# Patient Record
Sex: Female | Born: 1990
Health system: Southern US, Community
[De-identification: ages and names within clinical notes are randomized; demographics above are authoritative.]

## PROBLEM LIST (undated history)

## (undated) DIAGNOSIS — F988 Other specified behavioral and emotional disorders with onset usually occurring in childhood and adolescence: Secondary | ICD-10-CM

## (undated) DIAGNOSIS — Q52129 Other and unspecified longitudinal vaginal septum: Principal | ICD-10-CM

## (undated) DIAGNOSIS — G47419 Narcolepsy without cataplexy: Secondary | ICD-10-CM

## (undated) HISTORY — DX: Other and unspecified longitudinal vaginal septum: Q52.129

## (undated) HISTORY — PX: ANKLE SURGERY: SHX546

## (undated) HISTORY — PX: FINGER SURGERY: SHX640

---
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
## 2014-01-03 ENCOUNTER — Encounter (HOSPITAL_COMMUNITY): Payer: Self-pay | Admitting: Emergency Medicine

## 2014-01-03 ENCOUNTER — Emergency Department (HOSPITAL_COMMUNITY): Payer: BC Managed Care – PPO

## 2014-01-03 ENCOUNTER — Emergency Department (HOSPITAL_COMMUNITY)
Admission: EM | Admit: 2014-01-03 | Discharge: 2014-01-03 | Disposition: A | Discharge status: Home / Self Care | Payer: BC Managed Care – PPO | Attending: Emergency Medicine | Admitting: Emergency Medicine

## 2014-01-03 DIAGNOSIS — Y9389 Activity, other specified: Secondary | ICD-10-CM | POA: Insufficient documentation

## 2014-01-03 DIAGNOSIS — IMO0002 Reserved for concepts with insufficient information to code with codable children: Secondary | ICD-10-CM | POA: Insufficient documentation

## 2014-01-03 DIAGNOSIS — Y92410 Unspecified street and highway as the place of occurrence of the external cause: Secondary | ICD-10-CM | POA: Insufficient documentation

## 2014-01-03 DIAGNOSIS — S139XXA Sprain of joints and ligaments of unspecified parts of neck, initial encounter: Secondary | ICD-10-CM | POA: Insufficient documentation

## 2014-01-03 DIAGNOSIS — Z79899 Other long term (current) drug therapy: Secondary | ICD-10-CM | POA: Diagnosis not present

## 2014-01-03 DIAGNOSIS — S161XXA Strain of muscle, fascia and tendon at neck level, initial encounter: Secondary | ICD-10-CM

## 2014-01-03 DIAGNOSIS — S43402A Unspecified sprain of left shoulder joint, initial encounter: Secondary | ICD-10-CM

## 2014-01-03 DIAGNOSIS — S0993XA Unspecified injury of face, initial encounter: Secondary | ICD-10-CM | POA: Diagnosis present

## 2014-01-03 MED ORDER — CYCLOBENZAPRINE HCL 10 MG PO TABS: 10.0000 mg | ORAL_TABLET | Freq: Two times a day (BID) | ORAL | Status: DC | PRN

## 2014-01-03 MED ORDER — KETOROLAC TROMETHAMINE 30 MG/ML IJ SOLN
30.0000 mg | Freq: Once | INTRAMUSCULAR | Status: AC
  Administered 2014-01-03: 30 mg via INTRAMUSCULAR
  Filled 2014-01-03: qty 1

## 2014-01-03 MED ORDER — HYDROCODONE-ACETAMINOPHEN 5-325 MG PO TABS
1.0000 | ORAL_TABLET | Freq: Once | ORAL | Status: AC
Start: 2014-01-03 — End: 2014-01-03
  Administered 2014-01-03: 1 via ORAL
  Filled 2014-01-03: qty 1

## 2014-01-03 MED ORDER — OXYCODONE-ACETAMINOPHEN 5-325 MG PO TABS: ORAL_TABLET | ORAL | Status: DC

## 2014-01-03 NOTE — Discharge Instructions (Signed)
For pain control please take Ibuprofen (also known as Motrin or Advil) 400mg  (this is normally 2 over the counter pills) every 6 hours. Take with food to minimize stomach irritation  Take vicodin or Flexeril for breakthrough pain, do not drink alcohol, drive, care for children or do other critical tasks while taking vicodin or Flexeril.    Cervical Sprain A cervical sprain is when the tissues (ligaments) that hold the neck bones in place stretch or tear. HOME CARE   Put ice on the injured area.  Put ice in a plastic bag.  Place a towel between your skin and the bag.  Leave the ice on for 15-20 minutes, 3-4 times a day.  You may have been given a collar to wear. This collar keeps your neck from moving while you heal.  Do not take the collar off unless told by your doctor.  If you have long hair, keep it outside of the collar.  Ask your doctor before changing the position of your collar. You may need to change its position over time to make it more comfortable.  If you are allowed to take off the collar for cleaning or bathing, follow your doctor's instructions on how to do it safely.  Keep your collar clean by wiping it with mild soap and water. Dry it completely. If the collar has removable pads, remove them every 1-2 days to hand wash them with soap and water. Allow them to air dry. They should be dry before you wear them in the collar.  Do not drive while wearing the collar.  Only take medicine as told by your doctor.  Keep all doctor visits as told.  Keep all physical therapy visits as told.  Adjust your work station so that you have good posture while you work.  Avoid positions and activities that make your problems worse.  Warm up and stretch before being active. GET HELP IF:  Your pain is not controlled with medicine.  You cannot take less pain medicine over time as planned.  Your activity level does not improve as expected. GET HELP RIGHT AWAY IF:   You are  bleeding.  Your stomach is upset.  You have an allergic reaction to your medicine.  You develop new problems that you cannot explain.  You lose feeling (become numb) or you cannot move any part of your body (paralysis).  You have tingling or weakness in any part of your body.  Your symptoms get worse. Symptoms include:  Pain, soreness, stiffness, puffiness (swelling), or a burning feeling in your neck.  Pain when your neck is touched.  Shoulder or upper back pain.  Limited ability to move your neck.  Headache.  Dizziness.  Your hands or arms feel week, lose feeling, or tingle.  Muscle spasms.  Difficulty swallowing or chewing. MAKE SURE YOU:   Understand these instructions.  Will watch your condition.  Will get help right away if you are not doing well or get worse. Document Released: 12/13/2007 Document Revised: 02/26/2013 Document Reviewed: 01/01/2013 [facility] Patient Information 2015 [employer] [place]. This information is not intended to replace advice given to you by your health care provider. Make sure you discuss any questions you have with your health care provider.
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
## 2014-01-03 NOTE — ED Provider Notes (Signed)
Medical screening examination/treatment/procedure(s) were performed by non-physician practitioner and as supervising physician I was immediately available for consultation/collaboration.    Elizer Bostic, MD 01/03/14 1617 
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
## 2018-02-06 DIAGNOSIS — T7500XA Unspecified effects of lightning, initial encounter: Secondary | ICD-10-CM | POA: Insufficient documentation

## 2018-02-28 ENCOUNTER — Other Ambulatory Visit: Payer: Self-pay | Admitting: Obstetrics and Gynecology

## 2018-02-28 ENCOUNTER — Encounter: Payer: Self-pay | Admitting: Obstetrics and Gynecology

## 2018-02-28 DIAGNOSIS — Q52129 Other and unspecified longitudinal vaginal septum: Secondary | ICD-10-CM | POA: Insufficient documentation

## 2018-02-28 HISTORY — DX: Other and unspecified longitudinal vaginal septum: Q52.129

## 2018-03-15 ENCOUNTER — Ambulatory Visit (HOSPITAL_COMMUNITY): Admission: RE | Admit: 2018-03-15 | Payer: BLUE CROSS/BLUE SHIELD | Source: Ambulatory Visit

## 2018-10-08 ENCOUNTER — Emergency Department (HOSPITAL_COMMUNITY)
Admission: EM | Admit: 2018-10-08 | Discharge: 2018-10-08 | Disposition: A | Discharge status: Home / Self Care | Payer: BLUE CROSS/BLUE SHIELD | Attending: Emergency Medicine | Admitting: Emergency Medicine

## 2018-10-08 ENCOUNTER — Other Ambulatory Visit: Payer: Self-pay

## 2018-10-08 ENCOUNTER — Encounter (HOSPITAL_COMMUNITY): Payer: Self-pay | Admitting: Emergency Medicine

## 2018-10-08 DIAGNOSIS — K2900 Acute gastritis without bleeding: Secondary | ICD-10-CM | POA: Diagnosis not present

## 2018-10-08 DIAGNOSIS — F17200 Nicotine dependence, unspecified, uncomplicated: Secondary | ICD-10-CM | POA: Diagnosis not present

## 2018-10-08 DIAGNOSIS — R1084 Generalized abdominal pain: Secondary | ICD-10-CM

## 2018-10-08 DIAGNOSIS — Z79899 Other long term (current) drug therapy: Secondary | ICD-10-CM | POA: Insufficient documentation

## 2018-10-08 DIAGNOSIS — R1033 Periumbilical pain: Secondary | ICD-10-CM | POA: Diagnosis present

## 2018-10-08 HISTORY — DX: Other specified behavioral and emotional disorders with onset usually occurring in childhood and adolescence: F98.8

## 2018-10-08 HISTORY — DX: Narcolepsy without cataplexy: G47.419

## 2018-10-08 LAB — URINALYSIS, ROUTINE W REFLEX MICROSCOPIC
Bacteria, UA: NONE SEEN
Bilirubin Urine: NEGATIVE
Glucose, UA: NEGATIVE mg/dL
Hgb urine dipstick: NEGATIVE
Ketones, ur: 80 mg/dL — AB
Nitrite: NEGATIVE
Protein, ur: NEGATIVE mg/dL
Specific Gravity, Urine: 1.021 (ref 1.005–1.030)
pH: 5 (ref 5.0–8.0)

## 2018-10-08 LAB — CBC WITH DIFFERENTIAL/PLATELET
Abs Immature Granulocytes: 0.03 10*3/uL (ref 0.00–0.07)
Basophils Absolute: 0.1 10*3/uL (ref 0.0–0.1)
Basophils Relative: 1 %
Eosinophils Absolute: 0 10*3/uL (ref 0.0–0.5)
Eosinophils Relative: 0 %
HCT: 44.6 % (ref 36.0–46.0)
Hemoglobin: 13.8 g/dL (ref 12.0–15.0)
Immature Granulocytes: 0 %
Lymphocytes Relative: 2 %
Lymphs Abs: 0.2 10*3/uL — ABNORMAL LOW (ref 0.7–4.0)
MCH: 31.7 pg (ref 26.0–34.0)
MCHC: 30.9 g/dL (ref 30.0–36.0)
MCV: 102.3 fL — ABNORMAL HIGH (ref 80.0–100.0)
Monocytes Absolute: 0.5 10*3/uL (ref 0.1–1.0)
Monocytes Relative: 5 %
Neutro Abs: 10.3 10*3/uL — ABNORMAL HIGH (ref 1.7–7.7)
Neutrophils Relative %: 92 %
Platelets: 166 10*3/uL (ref 150–400)
RBC: 4.36 MIL/uL (ref 3.87–5.11)
RDW: 12.6 % (ref 11.5–15.5)
WBC: 11.1 10*3/uL — ABNORMAL HIGH (ref 4.0–10.5)
nRBC: 0 % (ref 0.0–0.2)

## 2018-10-08 LAB — COMPREHENSIVE METABOLIC PANEL
ALT: 76 U/L — ABNORMAL HIGH (ref 0–44)
AST: 72 U/L — ABNORMAL HIGH (ref 15–41)
Albumin: 4.4 g/dL (ref 3.5–5.0)
Alkaline Phosphatase: 80 U/L (ref 38–126)
Anion gap: 12 (ref 5–15)
BUN: 12 mg/dL (ref 6–20)
CO2: 21 mmol/L — ABNORMAL LOW (ref 22–32)
Calcium: 8.8 mg/dL — ABNORMAL LOW (ref 8.9–10.3)
Chloride: 107 mmol/L (ref 98–111)
Creatinine, Ser: 0.54 mg/dL (ref 0.44–1.00)
GFR calc Af Amer: >60 mL/min (ref >60)
GFR calc non Af Amer: >60 mL/min (ref >60)
Glucose, Bld: 80 mg/dL (ref 70–99)
Potassium: 3.9 mmol/L (ref 3.5–5.1)
Sodium: 140 mmol/L (ref 135–145)
Total Bilirubin: 1.5 mg/dL — ABNORMAL HIGH (ref 0.3–1.2)
Total Protein: 7.3 g/dL (ref 6.5–8.1)

## 2018-10-08 LAB — I-STAT BETA HCG BLOOD, ED (MC, WL, AP ONLY): I-stat hCG, quantitative: <5 m[IU]/mL

## 2018-10-08 LAB — ETHANOL: Alcohol, Ethyl (B): <10 mg/dL

## 2018-10-08 LAB — LIPASE, BLOOD: Lipase: 19 U/L (ref 11–51)

## 2018-10-08 MED ORDER — KETOROLAC TROMETHAMINE 15 MG/ML IJ SOLN
15.0000 mg | Freq: Once | INTRAMUSCULAR | Status: AC
  Administered 2018-10-08: 15 mg via INTRAVENOUS
  Filled 2018-10-08: qty 1

## 2018-10-08 MED ORDER — ONDANSETRON HCL 4 MG PO TABS: 4.0000 mg | ORAL_TABLET | Freq: Four times a day (QID) | ORAL | 0 refills | Status: DC

## 2018-10-08 MED ORDER — ONDANSETRON HCL 4 MG/2ML IJ SOLN
4.0000 mg | Freq: Once | INTRAMUSCULAR | Status: AC
  Administered 2018-10-08: 4 mg via INTRAVENOUS
  Filled 2018-10-08: qty 2

## 2018-10-08 MED ORDER — PANTOPRAZOLE SODIUM 20 MG PO TBEC: 20.0000 mg | DELAYED_RELEASE_TABLET | Freq: Every day | ORAL | 0 refills | Status: DC

## 2018-10-08 MED ORDER — SODIUM CHLORIDE 0.9 % IV BOLUS (SEPSIS)
1000.0000 mL | Freq: Once | INTRAVENOUS | Status: AC
  Administered 2018-10-08: 1000 mL via INTRAVENOUS

## 2018-10-08 NOTE — Discharge Instructions (Addendum)
Please avoid any alcohol use. Please have your liver tests rechecked by your primary care provider. Thank you for allowing me to care for you today. Please return to the emergency department if you have new or worsening symptoms. Take your medications as instructed.

## 2018-10-08 NOTE — ED Provider Notes (Signed)
[facility]-EMERGENCY DEPT Provider Note   CSN: [id number] Arrival date & time: 10/08/18  1357    History   Chief Complaint Chief Complaint  Patient presents with   Abdominal Pain    nausea, vomiting, diarrhea    HPI Tina Harrington is a 28 y.o. female.     Patient is a 28 year old female with no significant past medical history presents emergency department for abdominal pain.  She reports that the abdominal pain started this morning around 830.  Reports that it is a sharp and stabbing pain that is mostly constant around the area of her bellybutton.  It is associated with several episodes of diarrhea and vomiting.  Denies any sick contacts, fever, chills, vaginal discharge, dysuria.  She has not tried anything for relief.  There are no exacerbating or relieving factors.     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 No pertinent surgical history.   OB History   No obstetric history on file.      Home Medications    Prior to Admission medications   Medication Sig Start Date End Date Taking? Authorizing Provider  BIOTIN PO Take 1 tablet by mouth daily.   Yes [provider]  ibuprofen (ADVIL,MOTRIN) 200 MG tablet Take 200 mg by mouth daily as needed for headache or moderate pain.   Yes [provider]  lisdexamfetamine (VYVANSE) 60 MG capsule Take 60 mg by mouth every morning.   Yes [provider]  Melatonin 3 MG TABS Take 3 mg by mouth at bedtime.   Yes [provider]  cyclobenzaprine (FLEXERIL) 10 MG tablet Take 1 tablet (10 mg total) by mouth 2 (two) times daily as needed for muscle spasms. Patient not taking: Reported on 10/08/2018 01/03/14   Pisciotta, Joni Reining, PA-C  ondansetron (ZOFRAN) 4 MG tablet  Take 1 tablet (4 mg total) by mouth every 6 (six) hours. 10/08/18   Arlyn Dunning, PA-C  oxyCODONE-acetaminophen (PERCOCET/ROXICET) 5-325 MG per tablet 1 to 2 tabs PO q6hrs  PRN for pain Patient not taking: Reported on 10/08/2018 01/03/14   Pisciotta, Joni Reining, PA-C  pantoprazole (PROTONIX) 20 MG tablet Take 1 tablet (20 mg total) by mouth daily for 30 days. 10/08/18 11/07/18  Arlyn Dunning, PA-C    Family History History reviewed. No pertinent family history.  Social History Social History   Tobacco Use   Smoking status: Current Some Day Smoker   Smokeless tobacco: Never Used  Substance Use Topics   Alcohol use: Yes    Comment: drinks per week : "a lot"   Drug use: Yes    Types: Cocaine     Allergies   Ciprofloxacin and Azithromycin   Review of Systems Review of Systems  Constitutional: Negative for chills and fever.  HENT: Negative for ear pain and sore throat.   Eyes: Negative for pain and visual disturbance.  Respiratory: Negative for cough and shortness of breath.   Cardiovascular: Negative for chest pain and palpitations.  Gastrointestinal: Positive for abdominal pain, diarrhea, nausea and vomiting. Negative for abdominal distention and constipation.  Genitourinary: Negative for dysuria and hematuria.  Musculoskeletal: Negative for arthralgias and back pain.  Skin: Negative for color change and rash.  Neurological: Negative for seizures and syncope.  All other systems reviewed  and are negative.    Physical Exam Updated Vital Signs BP (!) 133/92 (BP Location: Left Arm)    Pulse 94    Temp 98 F (36.7 C) (Oral)    Resp 18    Ht 5\' 10"  (1.778 m)    Wt 77.1 kg    LMP 09/22/2018 (Exact Date)    SpO2 99%    BMI 24.39 kg/m   Physical Exam Vitals signs and nursing note reviewed.  Constitutional:      Appearance: Normal appearance.  HENT:     Head: Normocephalic.  Eyes:     Conjunctiva/sclera: Conjunctivae normal.  Cardiovascular:     Rate and Rhythm: Normal  rate and regular rhythm.  Pulmonary:     Effort: Pulmonary effort is normal.  Abdominal:     Tenderness: There is abdominal tenderness in the epigastric area. There is no right CVA tenderness, left CVA tenderness, guarding or rebound. Negative signs include Murphy's sign, Rovsing's sign, McBurney's sign, psoas sign and obturator sign.  Skin:    General: Skin is warm and dry.  Neurological:     Mental Status: She is alert.  Psychiatric:        Mood and Affect: Mood normal.      ED Treatments / Results  Labs (all labs ordered are listed, but only abnormal results are displayed) Labs Reviewed  CBC WITH DIFFERENTIAL/PLATELET - Abnormal; Notable for the following components:      Result Value   WBC 11.1 (*)    MCV 102.3 (*)    Neutro Abs 10.3 (*)    Lymphs Abs 0.2 (*)    All other components within normal limits  COMPREHENSIVE METABOLIC PANEL - Abnormal; Notable for the following components:   CO2 21 (*)    Calcium 8.8 (*)    AST 72 (*)    ALT 76 (*)    Total Bilirubin 1.5 (*)    All other components within normal limits  URINALYSIS, ROUTINE W REFLEX MICROSCOPIC - Abnormal; Notable for the following components:   Ketones, ur 80 (*)    Leukocytes,Ua TRACE (*)    All other components within normal limits  LIPASE, BLOOD  ETHANOL  I-STAT BETA HCG BLOOD, ED ([facility], [facility], [facility] ONLY)    EKG None  Radiology No results found.  Procedures Procedures (including critical care time)  Medications Ordered in ED Medications  sodium chloride 0.9 % bolus 1,000 mL (1,000 mLs Intravenous New Bag/Given 10/08/18 1503)  ondansetron (ZOFRAN) injection 4 mg (4 mg Intravenous Given 10/08/18 1503)  ketorolac (TORADOL) 15 MG/ML injection 15 mg (15 mg Intravenous Given 10/08/18 1503)     Initial Impression / Assessment and Plan / ED Course  I have reviewed the triage vital signs and the nursing notes.  Pertinent labs & imaging results that were available during my care of the patient were reviewed  by me and considered in my medical decision making (see chart for details).  Clinical Course as of Oct 08 1611  Tue Oct 08, 2018  1456 Patient is improved with Toradol and fluids.  Likely viral etiology or could be related to excessive alcohol intake.  Patient drinks alcohol daily.  Noted her AST and ALT were mildly elevated.  I counseled patient on the drinking well.  I start patient on Protonix and Zofran.  She was advised to return to the emergency department if symptoms worsen or follow-up with her primary care doctor for repeat LFTs if she remains improved.   [KM]  Clinical Course User Index [KM] Arlyn Dunning, PA-C      Based on review of vitals, medical screening exam, lab work and/or imaging, there does not appear to be an acute, emergent etiology for the patient's symptoms. Counseled pt on good return precautions and encouraged both PCP and ED follow-up as needed.  Prior to discharge, I also discussed incidental imaging findings with patient in detail and advised appropriate, recommended follow-up in detail.  Clinical Impression: 1. Acute gastritis without hemorrhage, unspecified gastritis type   2. Generalized abdominal pain     Disposition: Discharge    This note was prepared with assistance of Dragon voice recognition software. Occasional wrong-word or sound-a-like substitutions may have occurred due to the inherent limitations of voice recognition software.  Final Clinical Impressions(s) / ED Diagnoses   Final diagnoses:  Acute gastritis without hemorrhage, unspecified gastritis type  Generalized abdominal pain    ED Discharge Orders         Ordered    pantoprazole (PROTONIX) 20 MG tablet  Daily     10/08/18 1542    ondansetron (ZOFRAN) 4 MG tablet  Every 6 hours     10/08/18 1542           Jeral Pinch 10/08/18 1613    Linwood Dibbles, MD 10/09/18 [phone]

## 2018-10-08 NOTE — ED Notes (Signed)
Bed: QQ76 Expected date:  Expected time:  Means of arrival:  Comments: EMS-abdominal pain 28 y/o

## 2018-10-08 NOTE — ED Notes (Signed)
Pt reports traveling to [place] and [place] this month. Denies any URI symptoms.

## 2018-10-08 NOTE — ED Triage Notes (Signed)
Patient BIB [facility] from [facility] c/o n/v/d since 0830 this am. Pt reports etoh and cocaine use last night. Pt reports sharp pain across abdomen and umbilical region. States its nothing like she ever felt before. [facility] wanted pt to be evaluated and have lab work done. Pt given about ns and 4 mg iv zofran by EMS.

## 2019-10-23 DIAGNOSIS — W5501XA Bitten by cat, initial encounter: Secondary | ICD-10-CM | POA: Insufficient documentation

## 2019-10-23 DIAGNOSIS — S61451A Open bite of right hand, initial encounter: Secondary | ICD-10-CM | POA: Insufficient documentation

## 2020-04-18 ENCOUNTER — Emergency Department (HOSPITAL_COMMUNITY): Payer: BLUE CROSS/BLUE SHIELD

## 2020-04-18 ENCOUNTER — Encounter (HOSPITAL_COMMUNITY): Payer: Self-pay | Admitting: Emergency Medicine

## 2020-04-18 ENCOUNTER — Emergency Department (HOSPITAL_COMMUNITY)
Admission: EM | Admit: 2020-04-18 | Discharge: 2020-04-18 | Disposition: A | Discharge status: Home / Self Care | Payer: BLUE CROSS/BLUE SHIELD | Attending: Emergency Medicine | Admitting: Emergency Medicine

## 2020-04-18 DIAGNOSIS — S99912A Unspecified injury of left ankle, initial encounter: Secondary | ICD-10-CM | POA: Diagnosis present

## 2020-04-18 DIAGNOSIS — S82852A Displaced trimalleolar fracture of left lower leg, initial encounter for closed fracture: Secondary | ICD-10-CM | POA: Diagnosis not present

## 2020-04-18 DIAGNOSIS — Y9289 Other specified places as the place of occurrence of the external cause: Secondary | ICD-10-CM | POA: Insufficient documentation

## 2020-04-18 DIAGNOSIS — F17200 Nicotine dependence, unspecified, uncomplicated: Secondary | ICD-10-CM | POA: Insufficient documentation

## 2020-04-18 DIAGNOSIS — Y9301 Activity, walking, marching and hiking: Secondary | ICD-10-CM | POA: Diagnosis not present

## 2020-04-18 DIAGNOSIS — W108XXA Fall (on) (from) other stairs and steps, initial encounter: Secondary | ICD-10-CM | POA: Diagnosis not present

## 2020-04-18 MED ORDER — PROPOFOL 10 MG/ML IV BOLUS: 0.5000 mg/kg | Freq: Once | INTRAVENOUS | Status: AC

## 2020-04-18 MED ORDER — ONDANSETRON HCL 4 MG/2ML IJ SOLN
4.0000 mg | Freq: Once | INTRAMUSCULAR | Status: AC
  Administered 2020-04-18: 4 mg via INTRAVENOUS
  Filled 2020-04-18: qty 2

## 2020-04-18 MED ORDER — SODIUM CHLORIDE 0.9 % IV BOLUS
500.0000 mL | Freq: Once | INTRAVENOUS | Status: AC
  Administered 2020-04-18: 500 mL via INTRAVENOUS

## 2020-04-18 MED ORDER — HYDROCODONE-ACETAMINOPHEN 5-325 MG PO TABS: 1.0000 | ORAL_TABLET | Freq: Four times a day (QID) | ORAL | 0 refills | Status: DC | PRN

## 2020-04-18 MED ORDER — MORPHINE SULFATE (PF) 4 MG/ML IV SOLN
4.0000 mg | Freq: Once | INTRAVENOUS | Status: AC
  Administered 2020-04-18: 4 mg via INTRAVENOUS
  Filled 2020-04-18: qty 1

## 2020-04-18 MED ORDER — PROPOFOL 10 MG/ML IV BOLUS
INTRAVENOUS | Status: AC
  Administered 2020-04-18: 200 mg via INTRAVENOUS
  Filled 2020-04-18: qty 20

## 2020-04-18 NOTE — Sedation Documentation (Signed)
Ortho tech at bedside placing splint 
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
## 2020-04-18 NOTE — Progress Notes (Signed)
Orthopedic Tech Progress Note Patient Details:  Tina Harrington Oct 10, 1990 [id number]  Ortho Devices Type of Ortho Device: Post (short leg) splint, Stirrup splint Ortho Device/Splint Location: lle. applied post reduction with drs assist. Ortho Device/Splint Interventions: Ordered, Application, Adjustment   Post Interventions Patient Tolerated: Well Instructions Provided: Care of device, Adjustment of device   [name] Post 04/18/2020, 6:08 AM

## 2020-04-18 NOTE — Sedation Documentation (Signed)
Xray at bedside post reduction

## 2020-04-18 NOTE — Sedation Documentation (Signed)
prpocedural sedation beginning at this time. administered at this time by this rn.

## 2020-04-18 NOTE — Sedation Documentation (Signed)
 given at this time by this rn

## 2020-04-18 NOTE — ED Provider Notes (Signed)
[facility] EMERGENCY DEPARTMENT Provider Note   CSN: [id number] Arrival date & time: 04/18/20  0330     History Chief Complaint  Patient presents with  . Ankle Pain    Tina Harrington is a 29 y.o. female.  Patient presents to the emergency department with a chief complaint of left ankle injury.  She states that she had been drinking tonight and rolled her ankle while walking down some stairs.  There is a visible deformity to her left ankle.  She is not able to ambulate on it.  It is worsened with palpation and movement.  She reports some numbness around her great toe.  Denies any treatments prior to arrival.  The history is provided by the patient. No language interpreter was used.       Past Medical History:  Diagnosis Date  . ADD (attention deficit disorder)   . 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 Narcolepsy     Patient Active Problem List   Diagnosis Date Noted  . Longitudinal vaginal septum 02/28/2018    History reviewed. No pertinent surgical history.   OB History   No obstetric history on file.     No family history on file.  Social History   Tobacco Use  . Smoking status: Current Some Day Smoker  . Smokeless tobacco: Never Used  Vaping Use  . Vaping Use: Never used  Substance Use Topics  . Alcohol use: Yes    Comment: drinks per week : "a lot"  . Drug use: Yes    Types: Cocaine    Home Medications Prior to Admission medications   Medication Sig Start Date End Date Taking? Authorizing Provider  BIOTIN PO Take 1 tablet by mouth daily.    [provider]  cyclobenzaprine (FLEXERIL) 10 MG tablet Take 1 tablet (10 mg total) by mouth 2 (two) times daily as needed for muscle spasms. Patient not taking: Reported on 10/08/2018 01/03/14   Pisciotta, Joni Reining, PA-C  ibuprofen (ADVIL,MOTRIN) 200 MG tablet Take 200 mg by mouth daily as needed for headache or moderate pain.    [provider]  lisdexamfetamine (VYVANSE)  60 MG capsule Take 60 mg by mouth every morning.    [provider]  Melatonin 3 MG TABS Take 3 mg by mouth at bedtime.    [provider]  ondansetron (ZOFRAN) 4 MG tablet Take 1 tablet (4 mg total) by mouth every 6 (six) hours. 10/08/18   Arlyn Dunning, PA-C  oxyCODONE-acetaminophen (PERCOCET/ROXICET) 5-325 MG per tablet 1 to 2 tabs PO q6hrs  PRN for pain Patient not taking: Reported on 10/08/2018 01/03/14   Pisciotta, Joni Reining, PA-C  pantoprazole (PROTONIX) 20 MG tablet Take 1 tablet (20 mg total) by mouth daily for 30 days. 10/08/18 11/07/18  Arlyn Dunning, PA-C    Allergies    Ciprofloxacin and Azithromycin  Review of Systems   Review of Systems  All other systems reviewed and are negative.   Physical Exam Updated Vital Signs BP (!) 135/94   Pulse 86   Temp 98.3 F (36.8 C) (Oral)   Resp (!) 23   SpO2 100%   Physical Exam Nursing note and vitals reviewed.  Constitutional: Pt appears well-developed and well-nourished. No distress.  HENT:  Head: Normocephalic and atraumatic.  Eyes: Conjunctivae are normal.  Neck: Normal range of motion.  Cardiovascular: Normal rate, regular rhythm. Intact distal pulses.   Capillary refill < 3 sec.  Pulmonary/Chest: Effort normal and breath sounds normal.  Musculoskeletal:  Left ankle Pt exhibits obvious deformity, suspected dislocation, tenderness to palpation, diffuse swelling and contusion.   ROM: Deferred  Strength: Deferred Neurological: Pt  is alert. Coordination normal.  Sensation: Slightly decreased between first and second toes, otherwise intact Skin: Skin is warm and dry. Pt is not diaphoretic.  No evidence of open wound or skin tenting Psychiatric: Pt has a normal mood and affect.    ED Results / Procedures / Treatments   Labs (all labs ordered are listed, but only abnormal results are displayed) Labs Reviewed - No data to display  EKG None  Radiology DG Ankle Complete Left  Result Date:  04/18/2020 CLINICAL DATA:  Left ankle deformity after fall. EXAM: LEFT ANKLE COMPLETE - 3+ VIEW COMPARISON:  None. FINDINGS: Diffuse soft tissue swelling. Fracture/dislocation of the ankle, with the talus posteriorly and less so medially positioned relative to the distal tibia. Trimalleolar fracture with significant displacement. No talar dome fracture identified. IMPRESSION: Trimalleolar fracture dislocation of the ankle, as detailed above. Consider more complete characterization with CT. Electronically Signed   By: Jeronimo Greaves M.D.   On: 04/18/2020 05:06    Procedures Reduction of dislocation  Date/Time: 04/18/2020 5:50 AM Performed by: Roxy Horseman, PA-C Authorized by: Roxy Horseman, PA-C  Preparation: Patient was prepped and draped in the usual sterile fashion. Local anesthesia used: no  Anesthesia: Local anesthesia used: no  Sedation: Patient sedated: yes Sedation type: (See Dr. Edwin Dada note)  Patient tolerance: patient tolerated the procedure well with no immediate complications Comments: Patient underwent close reduction of left trimalleolar fracture dislocation.  The ankle was reduced using traction, and then placed in a splint.  There was good anatomical alignment post procedure.  Patient had intact distal pulses, good sensation, and was able to wiggle her toes post procedure.     Medications Ordered in ED Medications  propofol (DIPRIVAN) 10 mg/mL bolus/IV push 0.5 mg/kg (200 mg Intravenous Given 04/18/20 0542)  morphine 4 MG/ML injection 4 mg (4 mg Intravenous Given 04/18/20 0543)  ondansetron (ZOFRAN) injection 4 mg (4 mg Intravenous Given 04/18/20 0543)  sodium chloride 0.9 % bolus 500 mL (500 mLs Intravenous New Bag/Given 04/18/20 0542)    ED Course  I have reviewed the triage vital signs and the nursing notes.  Pertinent labs & imaging results that were available during my care of the patient were reviewed by me and considered in my medical decision making  (see chart for details).    MDM Rules/Calculators/A&P                          Patient here with left ankle injury.  She sustained a trimalleolar fracture dislocation of the left ankle after rolling her ankle while walking down the stairs tonight.  She has been drinking.  She denies pain elsewhere.  She appears stable.  The fracture/dislocation was reduced by me, sedation performed by Dr. Jacqulyn Bath.     I discussed the case with Dr. Magnus Ivan, from orthopedics, who advises that the patient call his office to make an appointment for next week.  Patient is to be kept nonweightbearing.   Final Clinical Impression(s) / ED Diagnoses Final diagnoses:  Closed trimalleolar fracture of left ankle, initial encounter    Rx / DC Orders ED Discharge Orders         Ordered    HYDROcodone-acetaminophen (NORCO/VICODIN) 5-325 MG tablet  Every 6 hours PRN        04/18/20 0630  Roxy Horseman, PA-C 04/18/20 [id number]    Maia Plan, MD 04/21/20 [phone]

## 2020-04-18 NOTE — Discharge Instructions (Addendum)
You sustained a fracture/dislocation of your left ankle.  The ankle was returned to anatomic alignment while in the emergency department.  You need to remain nonweightbearing on the left ankle.  He need to contact the orthopedic doctor listed on this document for follow-up.  You may require surgery to fix this permanently.  Take pain medication as prescribed.  Do not drive or operate heavy machinery while taking your pain medication.  It is like driving drunk.  Do not mix alcohol and pain medicine.  Take your medications as prescribed.

## 2020-04-18 NOTE — Sedation Documentation (Signed)
Manipulation beginning at this time by pa browning

## 2020-04-18 NOTE — Sedation Documentation (Signed)
Pt tolerated procedural well, cap refill wnl, nadn, pt back to baseline. vss on ccm.

## 2020-04-18 NOTE — Progress Notes (Signed)
Orthopedic Tech Progress Note Patient Details:  Tina Harrington [birth year] [id number]  Ortho Devices Type of Ortho Device: Crutches Ortho Device/Splint Location: lle. applied post reduction with drs assist. Ortho Device/Splint Interventions: Ordered, Adjustment   Post Interventions Patient Tolerated: Well Instructions Provided: Poper ambulation with device, Care of device, Adjustment of device   Toretto Tingler P Tuwanna Krausz 04/18/2020, 7:34 AM

## 2020-04-19 ENCOUNTER — Telehealth: Payer: Self-pay

## 2020-04-19 ENCOUNTER — Ambulatory Visit: Payer: Self-pay

## 2020-04-19 ENCOUNTER — Encounter: Payer: Self-pay | Admitting: Physician Assistant

## 2020-04-19 ENCOUNTER — Ambulatory Visit: Payer: BLUE CROSS/BLUE SHIELD | Admitting: Physician Assistant

## 2020-04-19 DIAGNOSIS — F900 Attention-deficit hyperactivity disorder, predominantly inattentive type: Secondary | ICD-10-CM | POA: Insufficient documentation

## 2020-04-19 DIAGNOSIS — N926 Irregular menstruation, unspecified: Secondary | ICD-10-CM | POA: Insufficient documentation

## 2020-04-19 DIAGNOSIS — R87619 Unspecified abnormal cytological findings in specimens from cervix uteri: Secondary | ICD-10-CM | POA: Insufficient documentation

## 2020-04-19 DIAGNOSIS — S82852A Displaced trimalleolar fracture of left lower leg, initial encounter for closed fracture: Secondary | ICD-10-CM

## 2020-04-19 DIAGNOSIS — R8290 Unspecified abnormal findings in urine: Secondary | ICD-10-CM | POA: Insufficient documentation

## 2020-04-19 DIAGNOSIS — G43909 Migraine, unspecified, not intractable, without status migrainosus: Secondary | ICD-10-CM | POA: Insufficient documentation

## 2020-04-19 DIAGNOSIS — B9689 Other specified bacterial agents as the cause of diseases classified elsewhere: Secondary | ICD-10-CM | POA: Insufficient documentation

## 2020-04-19 DIAGNOSIS — G47419 Narcolepsy without cataplexy: Secondary | ICD-10-CM | POA: Insufficient documentation

## 2020-04-19 MED ORDER — HYDROCODONE-ACETAMINOPHEN 5-325 MG PO TABS: 1.0000 | ORAL_TABLET | Freq: Four times a day (QID) | ORAL | 0 refills | Status: DC | PRN

## 2020-04-19 NOTE — Telephone Encounter (Signed)
Patient called in wanting refill of prescription of hydrocodone
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
## 2020-04-20 MED ORDER — HYDROCODONE-ACETAMINOPHEN 5-325 MG PO TABS: 1.0000 | ORAL_TABLET | Freq: Four times a day (QID) | ORAL | 0 refills | Status: DC | PRN

## 2020-04-20 NOTE — Telephone Encounter (Signed)
Please advise 

## 2020-04-20 NOTE — Addendum Note (Signed)
Addended by: Richardean Canal on: 04/20/2020 09:03 AM   Modules accepted: Orders

## 2020-04-21 NOTE — ED Provider Notes (Signed)
Medical screening examination/treatment/procedure(s) were conducted as a shared visit with non-physician practitioner(s) and myself.  I personally evaluated the patient during the encounter.  I directly supervised the patient's ankle fracture/dslocation treatment. Sedation note below.   .Sedation  Date/Time: 04/21/2020 7:27 AM Performed by: Maia Plan, MD Authorized by: Maia Plan, MD   Consent:    Consent obtained:  Verbal and written   Consent given by:  Patient   Risks discussed:  Allergic reaction, dysrhythmia, inadequate sedation, nausea, prolonged hypoxia resulting in organ damage, prolonged sedation necessitating reversal, respiratory compromise necessitating ventilatory assistance and intubation and vomiting   Alternatives discussed:  Analgesia without sedation, anxiolysis and regional anesthesia Universal protocol:    Procedure explained and questions answered to patient or proxy's satisfaction: yes     Relevant documents present and verified: yes     Test results available and properly labeled: yes     Imaging studies available: yes     Required blood products, implants, devices, and special equipment available: yes     Site/side marked: yes     Immediately prior to procedure a time out was called: yes     Patient identity confirmation method:  Verbally with patient Indications:    Procedure necessitating sedation performed by:  Physician performing sedation Pre-sedation assessment:    Time since last food or drink:  4 hours   ASA classification: class 1 - normal, healthy patient     Neck mobility: normal     Mouth opening:  3 or more finger widths   Thyromental distance:  4 finger widths   Mallampati score:  I - soft palate, uvula, fauces, pillars visible   Pre-sedation assessments completed and reviewed: airway patency, cardiovascular function, hydration status, mental status, nausea/vomiting, pain level, respiratory function and temperature   Immediate  pre-procedure details:    Reassessment: Patient reassessed immediately prior to procedure     Reviewed: vital signs, relevant labs/tests and NPO status     Verified: bag valve mask available, emergency equipment available, intubation equipment available, IV patency confirmed, oxygen available and suction available   Procedure details (see MAR for exact dosages):    Preoxygenation:  Nasal cannula   Sedation:  Propofol   Intended level of sedation: deep   Intra-procedure monitoring:  Blood pressure monitoring, cardiac monitor, continuous pulse oximetry, frequent LOC assessments, frequent vital sign checks and continuous capnometry   Intra-procedure events: none     Total Provider sedation time (minutes):  30 Post-procedure details:    Attendance: Constant attendance by certified staff until patient recovered     Recovery: Patient returned to pre-procedure baseline     Post-sedation assessments completed and reviewed: airway patency, cardiovascular function, hydration status, mental status, nausea/vomiting, pain level, respiratory function and temperature     Patient is stable for discharge or admission: yes     Patient tolerance:  Tolerated well, no immediate complications     Alona Bene, MD Emergency Medicine    Pansy Ostrovsky, Arlyss Repress, MD 04/21/20 [phone]

## 2020-04-22 ENCOUNTER — Other Ambulatory Visit: Payer: Self-pay | Admitting: Orthopaedic Surgery

## 2020-04-22 DIAGNOSIS — S82842A Displaced bimalleolar fracture of left lower leg, initial encounter for closed fracture: Secondary | ICD-10-CM

## 2020-04-22 MED ORDER — OXYCODONE HCL 5 MG PO TABS: 5.0000 mg | ORAL_TABLET | Freq: Four times a day (QID) | ORAL | 0 refills | Status: DC | PRN

## 2020-04-22 MED ORDER — ONDANSETRON 4 MG PO TBDP: 4.0000 mg | ORAL_TABLET | Freq: Three times a day (TID) | ORAL | 0 refills | Status: DC | PRN

## 2020-04-26 ENCOUNTER — Telehealth: Payer: Self-pay | Admitting: Radiology

## 2020-04-26 NOTE — Telephone Encounter (Signed)
FYI  Patient called triage line stating that she had surgery last week and had fracture blisters that she feels like are busting open and causing pain. She was unsure if she should come in prior to regularly scheduled post op visit.  After speaking with Bronson Curb, I advised patient that the blisters were taken down at the time of surgery and xeroform was placed over where they are. At this time, she does not need an antibiotic and can keep her regularly scheduled appt. Patient advised to call if continued questions/problems.

## 2020-05-06 ENCOUNTER — Ambulatory Visit (INDEPENDENT_AMBULATORY_CARE_PROVIDER_SITE_OTHER): Payer: BC Managed Care – PPO

## 2020-05-06 ENCOUNTER — Inpatient Hospital Stay: Payer: BLUE CROSS/BLUE SHIELD | Admitting: Physician Assistant

## 2020-05-06 ENCOUNTER — Ambulatory Visit (INDEPENDENT_AMBULATORY_CARE_PROVIDER_SITE_OTHER): Payer: BC Managed Care – PPO | Admitting: Orthopaedic Surgery

## 2020-05-06 ENCOUNTER — Encounter: Payer: Self-pay | Admitting: Orthopaedic Surgery

## 2020-05-06 DIAGNOSIS — Z9889 Other specified postprocedural states: Secondary | ICD-10-CM

## 2020-05-06 DIAGNOSIS — Z8781 Personal history of (healed) traumatic fracture: Secondary | ICD-10-CM

## 2020-05-06 DIAGNOSIS — S82852A Displaced trimalleolar fracture of left lower leg, initial encounter for closed fracture: Secondary | ICD-10-CM

## 2020-05-06 MED ORDER — OXYCODONE HCL 5 MG PO TABS: 5.0000 mg | ORAL_TABLET | Freq: Four times a day (QID) | ORAL | 0 refills | Status: DC | PRN

## 2020-05-06 NOTE — Progress Notes (Signed)
The patient is now 2 weeks status post open reduction/internal fixation of a complex left ankle fracture dislocation.  This is a trimalleolar fracture.  We were able to stabilize the ankle.  We stressed the syndesmosis after fixing the lateral medial malleolus syndrome was stable.  The posterior malleolus component was minimal.  She has been in a splint since surgery.  The splint is removed today.  Her foot is well-perfused.  All the fracture blisters that were there before that we unroofed have healed.  The medial lateral incisions look good with intact sutures.  We will remove the sutures today and placed Steri-Strips.  She can work on gentle range of motion of her left ankle.  She will remain nonweightbearing but in a cam walking boot which she can remove for ankle motion and hygiene as well as she does not need to sleep in the boot.  3 views of the left ankle show intact hardware from fixation of this complex left ankle fracture.  I will see her back in 4 weeks for repeat 3 views of the left ankle.  At that point we will likely advance her to full weightbearing.

## 2020-06-07 ENCOUNTER — Ambulatory Visit: Payer: BC Managed Care – PPO | Admitting: Orthopaedic Surgery

## 2020-06-23 ENCOUNTER — Ambulatory Visit (INDEPENDENT_AMBULATORY_CARE_PROVIDER_SITE_OTHER): Payer: Self-pay

## 2020-06-23 ENCOUNTER — Ambulatory Visit (INDEPENDENT_AMBULATORY_CARE_PROVIDER_SITE_OTHER): Payer: Self-pay | Admitting: Orthopaedic Surgery

## 2020-06-23 ENCOUNTER — Encounter: Payer: Self-pay | Admitting: Orthopaedic Surgery

## 2020-06-23 DIAGNOSIS — S82852A Displaced trimalleolar fracture of left lower leg, initial encounter for closed fracture: Secondary | ICD-10-CM

## 2020-06-23 DIAGNOSIS — Z9889 Other specified postprocedural states: Secondary | ICD-10-CM

## 2020-06-23 DIAGNOSIS — Z8781 Personal history of (healed) traumatic fracture: Secondary | ICD-10-CM

## 2020-06-23 NOTE — Progress Notes (Signed)
The patient is about 9 weeks out from surgery on a left closed ankle fracture dislocation.  This was a trimalleolar fracture.  We have now had her in a cam walking boot with weightbearing as tolerated.  She will still having some pain in the ankle but overall is making progress.  Examination ankle does show some stiffness throughout its arc of motion.  It is ligamentously stable and congruently well located.  The incisions of healed nicely.  3 views of the ankle on the left side show a healed trimalleolar fracture status post surgical fixation.  The ankle mortise is congruent.   Transition her to an ASO letter work on stretching exercises for her ankle.  She can weight-bear as tolerated.  She will transition out of the boot to the ASO when she is comfortable which from my standpoint can be as soon as today.  We will leave that up to her though.  We will see her back in 4 weeks but no x-rays are needed.  This is for an exam and to help determine whether or not she would benefit from outpatient physical therapy which I have offered to her.

## 2020-07-21 ENCOUNTER — Other Ambulatory Visit: Payer: Self-pay

## 2020-07-21 ENCOUNTER — Ambulatory Visit (INDEPENDENT_AMBULATORY_CARE_PROVIDER_SITE_OTHER): Payer: Self-pay | Admitting: Orthopaedic Surgery

## 2020-07-21 ENCOUNTER — Encounter: Payer: Self-pay | Admitting: Orthopaedic Surgery

## 2020-07-21 DIAGNOSIS — Z9889 Other specified postprocedural states: Secondary | ICD-10-CM

## 2020-07-21 DIAGNOSIS — S82852A Displaced trimalleolar fracture of left lower leg, initial encounter for closed fracture: Secondary | ICD-10-CM

## 2020-07-21 DIAGNOSIS — Z8781 Personal history of (healed) traumatic fracture: Secondary | ICD-10-CM

## 2020-07-21 NOTE — Progress Notes (Signed)
The patient is right at 3 months status post open reduction/fixation of a complex trimalleolar ankle fracture.  We fixed the medial lateral malleolus of her left ankle.  She is still walking in a cam walking boot but states she is doing better.  She has not been able to wear an ASO because of the swelling and getting into a regular shoe.  Examination of her left ankle does show some limitations with dorsiflexion and plantarflexion.  The ankle feels like this is stable.  The incisions have healed but there is still some residual swelling.  I would like to send her to outpatient physical therapy at this standpoint to work on improving the range of motion of her left ankle as well as strengthening and potentially edema reduction.  I would like to then see her back at about 6 weeks.  At that visit I would like to repeat 3 views of the left ankle.  All questions and concerns were answered and addressed.

## 2020-08-03 ENCOUNTER — Ambulatory Visit (INDEPENDENT_AMBULATORY_CARE_PROVIDER_SITE_OTHER): Payer: Self-pay | Admitting: Physical Therapy

## 2020-08-03 ENCOUNTER — Other Ambulatory Visit: Payer: Self-pay

## 2020-08-03 DIAGNOSIS — M25572 Pain in left ankle and joints of left foot: Secondary | ICD-10-CM

## 2020-08-03 DIAGNOSIS — R262 Difficulty in walking, not elsewhere classified: Secondary | ICD-10-CM

## 2020-08-03 DIAGNOSIS — R600 Localized edema: Secondary | ICD-10-CM

## 2020-08-03 DIAGNOSIS — M6281 Muscle weakness (generalized): Secondary | ICD-10-CM
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
## 2020-08-03 NOTE — Patient Instructions (Signed)
Access Code: [email or web address] URL: [email or web address] Date: 08/03/2020 Prepared by: Narda Amber  Exercises Seated Ankle Alphabet - 2-3 x daily - 7 x weekly - 3 sets - 10 reps Seated Ankle Plantarflexion with Resistance - 2-3 x daily - 7 x weekly - 2 sets - 10 reps Long Sitting Ankle Eversion with Resistance - 2-3 x daily - 7 x weekly - 2 sets - 10 reps Long Sitting Ankle Inversion with Anchored Resistance - 2-3 x daily - 7 x weekly - 2 sets - 10 reps Long Sitting Ankle Dorsiflexion with Anchored Resistance - 2-3 x daily - 7 x weekly - 2 sets - 10 reps Seated Toe Towel Scrunches - 2-3 x daily - 7 x weekly - 3 reps Seated Marble Pick-Up with Toes - 2-3 x daily - 7 x weekly - 10 reps Long Sitting Calf Stretch with Strap - 2-3 x daily - 7 x weekly - 5 reps - 30 seconds hold Soleus Stretch on Wall - 2-3 x daily - 7 x weekly - 3 reps - 30 seconds hold

## 2020-08-12 ENCOUNTER — Telehealth: Payer: Self-pay | Admitting: Rehabilitative and Restorative Service Providers"

## 2020-08-12 ENCOUNTER — Encounter: Payer: Self-pay | Admitting: Rehabilitative and Restorative Service Providers"

## 2020-08-12 NOTE — Telephone Encounter (Signed)
Called to check on because of no show for today's 1:45 appointment.  Left message reminding her of her next appointment Tuesday February 8 at 2:30 PM with [facility].  Left [phone] number to call to cancel or reschedule if unable to attend her next appointment.

## 2020-08-17 ENCOUNTER — Encounter: Payer: Self-pay | Admitting: Physical Therapy

## 2020-08-17 ENCOUNTER — Ambulatory Visit (INDEPENDENT_AMBULATORY_CARE_PROVIDER_SITE_OTHER): Payer: Self-pay | Admitting: Physical Therapy

## 2020-08-17 ENCOUNTER — Other Ambulatory Visit: Payer: Self-pay

## 2020-08-17 DIAGNOSIS — M6281 Muscle weakness (generalized): Secondary | ICD-10-CM

## 2020-08-17 DIAGNOSIS — M25572 Pain in left ankle and joints of left foot: Secondary | ICD-10-CM

## 2020-08-17 DIAGNOSIS — R600 Localized edema: Secondary | ICD-10-CM

## 2020-08-17 DIAGNOSIS — R262 Difficulty in walking, not elsewhere classified: Secondary | ICD-10-CM

## 2020-08-17 NOTE — Patient Instructions (Signed)
Access Code: [email or web address] URL: [email or web address] Date: 08/17/2020 Prepared by: Narda Amber  Exercises Seated Ankle Alphabet - 2-3 x daily - 7 x weekly Seated Ankle Plantarflexion with Resistance - 2-3 x daily - 7 x weekly - 2 sets - 10 reps Long Sitting Ankle Eversion with Resistance - 2-3 x daily - 7 x weekly - 2 sets - 10 reps Long Sitting Ankle Inversion with Anchored Resistance - 2-3 x daily - 7 x weekly - 2 sets - 10 reps Long Sitting Ankle Dorsiflexion with Anchored Resistance - 2-3 x daily - 7 x weekly - 2 sets - 10 reps Seated Toe Towel Scrunches - 2-3 x daily - 7 x weekly - 3 reps Seated Marble Pick-Up with Toes - 2-3 x daily - 7 x weekly - 10 reps Long Sitting Calf Stretch with Strap - 2-3 x daily - 7 x weekly - 5 reps - 30 seconds hold Soleus Stretch on Wall - 2-3 x daily - 7 x weekly - 3 reps - 30 seconds hold Standing Heel Raise - 2-3 x daily - 7 x weekly - 2 sets - 10 reps Standing Gastroc Stretch on Foam 1/2 Roll - 2-3 x daily - 7 x weekly - 3 reps - 30 seconds hold Single Leg Balance with Opposite Leg Star Reach - 2-3 x daily - 7 x weekly - 2 sets - 5 reps
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
## 2020-08-24 ENCOUNTER — Encounter: Payer: Self-pay | Admitting: Physical Therapy

## 2020-08-24 ENCOUNTER — Ambulatory Visit (INDEPENDENT_AMBULATORY_CARE_PROVIDER_SITE_OTHER): Payer: Self-pay | Admitting: Physical Therapy

## 2020-08-24 ENCOUNTER — Other Ambulatory Visit: Payer: Self-pay

## 2020-08-24 DIAGNOSIS — R600 Localized edema: Secondary | ICD-10-CM

## 2020-08-24 DIAGNOSIS — M25572 Pain in left ankle and joints of left foot: Secondary | ICD-10-CM

## 2020-08-24 DIAGNOSIS — M6281 Muscle weakness (generalized): Secondary | ICD-10-CM

## 2020-08-24 DIAGNOSIS — R262 Difficulty in walking, not elsewhere classified: Secondary | ICD-10-CM
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
## 2020-08-31 ENCOUNTER — Ambulatory Visit (INDEPENDENT_AMBULATORY_CARE_PROVIDER_SITE_OTHER): Payer: Self-pay | Admitting: Physical Therapy

## 2020-08-31 ENCOUNTER — Other Ambulatory Visit: Payer: Self-pay

## 2020-08-31 ENCOUNTER — Encounter: Payer: Self-pay | Admitting: Physical Therapy

## 2020-08-31 DIAGNOSIS — R262 Difficulty in walking, not elsewhere classified: Secondary | ICD-10-CM

## 2020-08-31 DIAGNOSIS — R600 Localized edema: Secondary | ICD-10-CM

## 2020-08-31 DIAGNOSIS — M25572 Pain in left ankle and joints of left foot: Secondary | ICD-10-CM

## 2020-08-31 DIAGNOSIS — M6281 Muscle weakness (generalized): Secondary | ICD-10-CM
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
## 2020-09-01 ENCOUNTER — Ambulatory Visit: Payer: Self-pay

## 2020-09-01 ENCOUNTER — Ambulatory Visit (INDEPENDENT_AMBULATORY_CARE_PROVIDER_SITE_OTHER): Payer: Self-pay | Admitting: Orthopaedic Surgery

## 2020-09-01 ENCOUNTER — Encounter: Payer: Self-pay | Admitting: Orthopaedic Surgery

## 2020-09-01 DIAGNOSIS — Z9889 Other specified postprocedural states: Secondary | ICD-10-CM

## 2020-09-01 DIAGNOSIS — Z8781 Personal history of (healed) traumatic fracture: Secondary | ICD-10-CM

## 2020-09-01 NOTE — Progress Notes (Signed)
The patient comes in today about 4 months status post a fracture dislocation of her left ankle that did require open reduction/centralization of the lateral and medial malleolus.  She is doing well overall.  She does have occasional swelling and soreness but therapy has helped her quite a bit.  She does have significant improved range of motion of her left ankle.  There is still some mild swelling but the incisions of healed nicely.  The ankle is ligamentously stable.  At this point we did not x-ray today.  She can increase her activities as comfort allows.  She should avoid running.  She can come in and out of her ASO as comfort allows.  She understands that she can still develop significant posttraumatic arthritis in that ankle and if she has any issues at all she knows to come back and see [place].  All questions and concerns were answered and addressed.

## 2020-09-09 ENCOUNTER — Encounter: Payer: Self-pay | Admitting: Rehabilitative and Restorative Service Providers"

## 2020-09-16 ENCOUNTER — Ambulatory Visit (INDEPENDENT_AMBULATORY_CARE_PROVIDER_SITE_OTHER): Payer: Self-pay | Admitting: Rehabilitative and Restorative Service Providers"

## 2020-09-16 ENCOUNTER — Encounter: Payer: Self-pay | Admitting: Rehabilitative and Restorative Service Providers"

## 2020-09-16 ENCOUNTER — Other Ambulatory Visit: Payer: Self-pay

## 2020-09-16 DIAGNOSIS — M25572 Pain in left ankle and joints of left foot: Secondary | ICD-10-CM

## 2020-09-16 DIAGNOSIS — R262 Difficulty in walking, not elsewhere classified: Secondary | ICD-10-CM

## 2020-09-16 DIAGNOSIS — M25672 Stiffness of left ankle, not elsewhere classified: Secondary | ICD-10-CM

## 2020-09-16 DIAGNOSIS — M6281 Muscle weakness (generalized): Secondary | ICD-10-CM

## 2020-09-16 DIAGNOSIS — R600 Localized edema: Secondary | ICD-10-CM
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
## 2020-09-21 ENCOUNTER — Other Ambulatory Visit: Payer: Self-pay

## 2020-09-21 ENCOUNTER — Ambulatory Visit (INDEPENDENT_AMBULATORY_CARE_PROVIDER_SITE_OTHER): Payer: Self-pay | Admitting: Physical Therapy

## 2020-09-21 DIAGNOSIS — M25572 Pain in left ankle and joints of left foot: Secondary | ICD-10-CM

## 2020-09-21 DIAGNOSIS — R262 Difficulty in walking, not elsewhere classified: Secondary | ICD-10-CM

## 2020-09-21 DIAGNOSIS — M25672 Stiffness of left ankle, not elsewhere classified: Secondary | ICD-10-CM

## 2020-09-21 DIAGNOSIS — M6281 Muscle weakness (generalized): Secondary | ICD-10-CM

## 2020-09-21 DIAGNOSIS — R600 Localized edema: Secondary | ICD-10-CM
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
## 2020-09-30 ENCOUNTER — Encounter: Payer: Self-pay | Admitting: Rehabilitative and Restorative Service Providers"

## 2020-10-05 ENCOUNTER — Encounter: Payer: Self-pay | Admitting: Physical Therapy

## 2020-10-05 ENCOUNTER — Ambulatory Visit (INDEPENDENT_AMBULATORY_CARE_PROVIDER_SITE_OTHER): Payer: Self-pay | Admitting: Physical Therapy

## 2020-10-05 ENCOUNTER — Other Ambulatory Visit: Payer: Self-pay

## 2020-10-05 DIAGNOSIS — M25672 Stiffness of left ankle, not elsewhere classified: Secondary | ICD-10-CM

## 2020-10-05 DIAGNOSIS — R600 Localized edema: Secondary | ICD-10-CM

## 2020-10-05 DIAGNOSIS — M25572 Pain in left ankle and joints of left foot: Secondary | ICD-10-CM

## 2020-10-05 DIAGNOSIS — R262 Difficulty in walking, not elsewhere classified: Secondary | ICD-10-CM

## 2020-10-05 DIAGNOSIS — M6281 Muscle weakness (generalized): Secondary | ICD-10-CM
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
## 2020-10-12 ENCOUNTER — Encounter: Payer: Self-pay | Admitting: Physical Therapy

## 2020-10-27 ENCOUNTER — Ambulatory Visit (INDEPENDENT_AMBULATORY_CARE_PROVIDER_SITE_OTHER): Payer: Self-pay | Admitting: Rehabilitative and Restorative Service Providers"

## 2020-10-27 ENCOUNTER — Encounter: Payer: Self-pay | Admitting: Rehabilitative and Restorative Service Providers"

## 2020-10-27 ENCOUNTER — Other Ambulatory Visit: Payer: Self-pay

## 2020-10-27 DIAGNOSIS — R262 Difficulty in walking, not elsewhere classified: Secondary | ICD-10-CM

## 2020-10-27 DIAGNOSIS — M25572 Pain in left ankle and joints of left foot: Secondary | ICD-10-CM

## 2020-10-27 DIAGNOSIS — R600 Localized edema: Secondary | ICD-10-CM

## 2020-10-27 DIAGNOSIS — M25672 Stiffness of left ankle, not elsewhere classified: Secondary | ICD-10-CM

## 2020-10-27 DIAGNOSIS — M6281 Muscle weakness (generalized): Secondary | ICD-10-CM
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
## 2020-10-27 NOTE — Patient Instructions (Signed)
Access Code: [email or web address] URL: [email or web address] Date: 10/27/2020 Prepared by: Pauletta Browns  Exercises Seated Ankle Alphabet - 2-3 x daily - 7 x weekly Seated Ankle Plantarflexion with Resistance - 2-3 x daily - 7 x weekly - 2 sets - 10 reps Long Sitting Ankle Eversion with Resistance - 2-3 x daily - 7 x weekly - 2 sets - 10 reps Long Sitting Ankle Inversion with Anchored Resistance - 2-3 x daily - 7 x weekly - 2 sets - 10 reps Long Sitting Ankle Dorsiflexion with Anchored Resistance - 2-3 x daily - 7 x weekly - 2 sets - 10 reps Seated Toe Towel Scrunches - 2-3 x daily - 7 x weekly - 3 reps Seated Marble Pick-Up with Toes - 2-3 x daily - 7 x weekly - 10 reps Long Sitting Calf Stretch with Strap - 2-3 x daily - 7 x weekly - 5 reps - 30 seconds hold Soleus Stretch on Wall - 2-3 x daily - 7 x weekly - 3 reps - 30 seconds hold Standing Heel Raise - 2-3 x daily - 7 x weekly - 2 sets - 10 reps Standing Gastroc Stretch on Foam 1/2 Roll - 2-3 x daily - 7 x weekly - 3 reps - 30 seconds hold Single Leg Balance with Opposite Leg Star Reach - 2-3 x daily - 7 x weekly - 2 sets - 5 reps Single Leg Balance - 1 x daily - 7 x weekly - 1 sets - 5 reps - 20 seconds hold

## 2020-11-10 ENCOUNTER — Encounter: Payer: Self-pay | Admitting: Rehabilitative and Restorative Service Providers"

## 2021-08-10 ENCOUNTER — Encounter: Payer: Self-pay | Admitting: Physician Assistant

## 2021-08-10 ENCOUNTER — Ambulatory Visit (INDEPENDENT_AMBULATORY_CARE_PROVIDER_SITE_OTHER): Payer: Self-pay | Admitting: Physician Assistant

## 2021-08-10 ENCOUNTER — Ambulatory Visit (INDEPENDENT_AMBULATORY_CARE_PROVIDER_SITE_OTHER): Payer: Self-pay

## 2021-08-10 ENCOUNTER — Other Ambulatory Visit: Payer: Self-pay

## 2021-08-10 DIAGNOSIS — Z8781 Personal history of (healed) traumatic fracture: Secondary | ICD-10-CM

## 2021-08-10 DIAGNOSIS — Z9889 Other specified postprocedural states: Secondary | ICD-10-CM
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
## 2023-08-16 DIAGNOSIS — R059 Cough, unspecified: Secondary | ICD-10-CM | POA: Diagnosis not present

## 2023-08-16 DIAGNOSIS — R5383 Other fatigue: Secondary | ICD-10-CM | POA: Diagnosis not present

## 2023-09-25 ENCOUNTER — Encounter: Payer: Self-pay | Admitting: Family Medicine

## 2023-09-25 ENCOUNTER — Ambulatory Visit (INDEPENDENT_AMBULATORY_CARE_PROVIDER_SITE_OTHER): Payer: Self-pay | Admitting: Family Medicine

## 2023-09-25 VITALS — BP 100/60 | HR 88 | Temp 98.5°F | Ht 70.5 in | Wt 218.3 lb

## 2023-09-25 DIAGNOSIS — R000 Tachycardia, unspecified: Secondary | ICD-10-CM | POA: Diagnosis not present

## 2023-09-25 DIAGNOSIS — F900 Attention-deficit hyperactivity disorder, predominantly inattentive type: Secondary | ICD-10-CM | POA: Diagnosis not present

## 2023-09-25 DIAGNOSIS — I158 Other secondary hypertension: Secondary | ICD-10-CM

## 2023-09-25 DIAGNOSIS — I10 Essential (primary) hypertension: Secondary | ICD-10-CM | POA: Insufficient documentation

## 2023-09-25 LAB — COMPREHENSIVE METABOLIC PANEL
ALT: 23 U/L (ref 0–35)
AST: 24 U/L (ref 0–37)
Albumin: 4.1 g/dL (ref 3.5–5.2)
Alkaline Phosphatase: 83 U/L (ref 39–117)
BUN: 12 mg/dL (ref 6–23)
CO2: 26 meq/L (ref 19–32)
Calcium: 9.1 mg/dL (ref 8.4–10.5)
Chloride: 104 meq/L (ref 96–112)
Creatinine, Ser: 0.63 mg/dL (ref 0.40–1.20)
GFR: 117.08 mL/min (ref >60.00)
Glucose, Bld: 88 mg/dL (ref 70–99)
Potassium: 4.2 meq/L (ref 3.5–5.1)
Sodium: 137 meq/L (ref 135–145)
Total Bilirubin: 0.3 mg/dL (ref 0.2–1.2)
Total Protein: 6.8 g/dL (ref 6.0–8.3)

## 2023-09-25 LAB — TSH: TSH: 2.88 u[IU]/mL (ref 0.35–5.50)

## 2023-09-25 LAB — CBC WITH DIFFERENTIAL/PLATELET
Basophils Absolute: 0.1 10*3/uL (ref 0.0–0.1)
Basophils Relative: 0.8 % (ref 0.0–3.0)
Eosinophils Absolute: 0.2 10*3/uL (ref 0.0–0.7)
Eosinophils Relative: 2.9 % (ref 0.0–5.0)
HCT: 40.3 % (ref 36.0–46.0)
Hemoglobin: 13.1 g/dL (ref 12.0–15.0)
Lymphocytes Relative: 23 % (ref 12.0–46.0)
Lymphs Abs: 1.8 10*3/uL (ref 0.7–4.0)
MCHC: 32.5 g/dL (ref 30.0–36.0)
MCV: 91.9 fl (ref 78.0–100.0)
Monocytes Absolute: 0.6 10*3/uL (ref 0.1–1.0)
Monocytes Relative: 7.3 % (ref 3.0–12.0)
Neutro Abs: 5 10*3/uL (ref 1.4–7.7)
Neutrophils Relative %: 66 % (ref 43.0–77.0)
Platelets: 247 10*3/uL (ref 150.0–400.0)
RBC: 4.38 Mil/uL (ref 3.87–5.11)
RDW: 13.6 % (ref 11.5–15.5)
WBC: 7.6 10*3/uL (ref 4.0–10.5)

## 2023-09-25 MED ORDER — LISDEXAMFETAMINE DIMESYLATE 50 MG PO CAPS: 50.0000 mg | ORAL_CAPSULE | Freq: Every day | ORAL | 0 refills | Status: DC

## 2023-09-25 NOTE — Assessment & Plan Note (Signed)
 Reviewed PDMP, signed controlled substance policy. Will reduce down to 50 mg daily of the vyvanse, RTC every 3 months for refills.

## 2023-09-25 NOTE — Assessment & Plan Note (Signed)
 Possibly a side effect of the vyvanse, continue metoprolol 50 mg daily.

## 2023-09-25 NOTE — Assessment & Plan Note (Signed)
 History of, on metoprolol 50 mg daily, will continue this as prescribed. Checking CBC, CMP and TSH today to rule out other causes. Pt reports this is likely due to the vyvanse she is taking.

## 2023-09-25 NOTE — Progress Notes (Signed)
 New Patient Office Visit  Subjective   Patient ID: Tina Harrington, female    DOB: 05/09/1991  Age: 33 y.o. MRN: [id number]  CC:  Chief Complaint  Patient presents with   Establish Care    HPI Harold Mattes presents to establish care Was previously seen at [facility]. Patient states that she was taking vyvanse for ADHD, states that it is no longer covered for this medication and it was too expensive. States that she was a child when she was diagnosed with ADHD. States she was previously on concerta, adderall, focalin, etc. States she didn't like the focalin or concerta-- felt zombie-like on these medications. States that the vyvanse worked best for her, but felt like the dose was too high. States the reason the dose was increased was because she thought it would work better. States that she still had trouble with energy level and her attention. States that the dose increase didn't really help with either of those things. States that she thinks the 50 mg worked the best for her. Her biggest concern was it being out of stock and having to search for it.  Pt states she was also diagnosed with narcolepsy also in 2014. States that she was referred to a specialist but didn't have the sleep study due to the expense of the study. States he did diagnose her based on her symptoms.   I have reviewed all aspects of the patient's medical history including social, family, and surgical history.   Current Outpatient Medications  Medication Instructions   BIOTIN PO 1 tablet, Daily   [facility] NASAL SPRAY 0.05 % nasal spray 1 spray, 2 times daily   ibuprofen (ADVIL) 400 mg, 3 times daily PRN   ipratropium (ATROVENT) 0.06 % nasal spray Place into both nostrils.   lisdexamfetamine (VYVANSE) 50 mg, Oral, Daily   [START ON 10/22/2023] lisdexamfetamine (VYVANSE) 50 mg, Oral, Daily   [START ON 11/16/2023] lisdexamfetamine (VYVANSE) 50 mg, Oral, Daily   melatonin 3 mg, Daily at bedtime   metoprolol succinate  (TOPROL-XL) 50 mg, Oral, Daily   ondansetron (ZOFRAN ODT) 4 mg, Oral, Every 8 hours PRN   Zinc Sulfate 220 (50 Zn) MG TABS 1 tablet, Daily    Past Medical History:  Diagnosis Date   ADD (attention deficit disorder)    Longitudinal vaginal septum 02/28/2018   Narcolepsy     Past Surgical History:  Procedure Laterality Date   ANKLE SURGERY Left    04/2020   FINGER SURGERY     right 5th digit 2022    Family History  Problem Relation Age of Onset   CVA Maternal Grandmother     Social History   Socioeconomic History   Marital status: Single    Spouse name: Not on file   Number of children: Not on file   Years of education: Not on file   Highest education level: Some college, no degree  Occupational History   Not on file  Tobacco Use   Smoking status: Former    Types: Cigarettes   Smokeless tobacco: Never  Vaping Use   Vaping status: Never Used  Substance and Sexual Activity   Alcohol use: Yes    Comment: drinks per week : "a lot"   Drug use: Not Currently    Types: Cocaine   Sexual activity: Yes    Birth control/protection: None  Other Topics Concern   Not on file  Social History Narrative   Not on file   Social Drivers of  Health   Financial Resource Strain: High Risk (09/24/2023)   Overall Financial Resource Strain (CARDIA)    Difficulty of Paying Living Expenses: Very hard  Food Insecurity: No Food Insecurity (09/24/2023)   Hunger Vital Sign    Worried About Running Out of Food in the Last Year: Never true    Ran Out of Food in the Last Year: Never true  Transportation Needs: No Transportation Needs (09/24/2023)   PRAPARE - Administrator, Civil Service (Medical): No    Lack of Transportation (Non-Medical): No  Physical Activity: Insufficiently Active (09/24/2023)   Exercise Vital Sign    Days of Exercise per Week: 5 days    Minutes of Exercise per Session: 20 min  Stress: Stress Concern Present (09/24/2023)   [employer] of Occupational  Health - Occupational Stress Questionnaire    Feeling of Stress : Rather much  Social Connections: Unknown (09/24/2023)   Social Connection and Isolation Panel [NHANES]    Frequency of Communication with Friends and Family: More than three times a week    Frequency of Social Gatherings with Friends and Family: More than three times a week    Attends Religious Services: Patient declined    Database administrator or Organizations: No    Attends Engineer, structural: Not on file    Marital Status: Patient declined  Intimate Partner Violence: Not on file    Review of Systems  All other systems reviewed and are negative.       Objective   BP 100/60   Pulse 88   Temp 98.5 F (36.9 C) (Oral)   Ht 5' 10.5" (1.791 m)   Wt 218 lb 4.8 oz (99 kg)   LMP 09/21/2023 (Exact Date)   SpO2 100%   BMI 30.88 kg/m   Physical Exam Vitals reviewed.  Constitutional:      Appearance: Normal appearance. She is well-groomed. She is obese.  Neck:     Thyroid: No thyromegaly.  Cardiovascular:     Rate and Rhythm: Normal rate and regular rhythm.     Pulses: Normal pulses.     Heart sounds: S1 normal and S2 normal.  Pulmonary:     Effort: Pulmonary effort is normal.     Breath sounds: Normal breath sounds and air entry.  Neurological:     Mental Status: She is alert and oriented to person, place, and time. Mental status is at baseline.     Gait: Gait is intact.  Psychiatric:        Mood and Affect: Mood and affect normal.        Speech: Speech normal.        Behavior: Behavior normal.        Judgment: Judgment normal.         Assessment & Plan:  Attention deficit hyperactivity disorder, predominantly inattentive type Assessment & Plan: Reviewed PDMP, signed controlled substance policy. Will reduce down to 50 mg daily of the vyvanse, RTC every 3 months for refills.   Orders: -     Lisdexamfetamine Dimesylate; Take 1 capsule (50 mg total) by mouth daily.  Dispense: 30 capsule;  Refill: 0 -     Lisdexamfetamine Dimesylate; Take 1 capsule (50 mg total) by mouth daily.  Dispense: 30 capsule; Refill: 0 -     Lisdexamfetamine Dimesylate; Take 1 capsule (50 mg total) by mouth daily.  Dispense: 30 capsule; Refill: 0  Sinus tachycardia Assessment & Plan: History of, on metoprolol 50 mg daily, will  continue this as prescribed. Checking CBC, CMP and TSH today to rule out other causes. Pt reports this is likely due to the vyvanse she is taking.   Orders: -     TSH; Future -     CBC with Differential/Platelet; Future  Other secondary hypertension Assessment & Plan: Possibly a side effect of the vyvanse, continue metoprolol 50 mg daily.   Orders: -     Comprehensive metabolic panel; Future    Return in about 3 months (around 12/26/2023) for annual physical with pap smear.   Karie Georges, MD

## 2023-10-16 ENCOUNTER — Other Ambulatory Visit: Payer: Self-pay | Admitting: Family Medicine

## 2023-10-17 MED ORDER — METOPROLOL SUCCINATE ER 50 MG PO TB24: 50.0000 mg | ORAL_TABLET | Freq: Every day | ORAL | 0 refills | Status: DC

## 2023-12-25 ENCOUNTER — Encounter: Payer: Self-pay | Admitting: Family Medicine

## 2023-12-25 DIAGNOSIS — F900 Attention-deficit hyperactivity disorder, predominantly inattentive type: Secondary | ICD-10-CM

## 2023-12-25 MED ORDER — LISDEXAMFETAMINE DIMESYLATE 50 MG PO CAPS: 50.0000 mg | ORAL_CAPSULE | Freq: Every day | ORAL | 0 refills | Status: DC

## 2023-12-27 ENCOUNTER — Encounter: Payer: Self-pay | Admitting: Family Medicine

## 2023-12-27 ENCOUNTER — Ambulatory Visit (INDEPENDENT_AMBULATORY_CARE_PROVIDER_SITE_OTHER): Admitting: Family Medicine

## 2023-12-27 VITALS — BP 118/72 | HR 84 | Temp 98.4°F | Ht 70.0 in | Wt 219.0 lb

## 2023-12-27 DIAGNOSIS — F900 Attention-deficit hyperactivity disorder, predominantly inattentive type: Secondary | ICD-10-CM

## 2023-12-27 MED ORDER — AMPHETAMINE-DEXTROAMPHETAMINE 10 MG PO TABS: 10.0000 mg | ORAL_TABLET | Freq: Every day | ORAL | 0 refills | Status: DC

## 2023-12-27 NOTE — Patient Instructions (Signed)
 [email or web address]  [email or web address]  [email or web address]
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
## 2024-01-23 ENCOUNTER — Encounter: Payer: Self-pay | Admitting: Family Medicine

## 2024-01-23 DIAGNOSIS — F900 Attention-deficit hyperactivity disorder, predominantly inattentive type: Secondary | ICD-10-CM

## 2024-01-24 MED ORDER — AMPHETAMINE-DEXTROAMPHETAMINE 10 MG PO TABS: 10.0000 mg | ORAL_TABLET | Freq: Every day | ORAL | 0 refills | Status: DC

## 2024-02-04 MED ORDER — LISDEXAMFETAMINE DIMESYLATE 50 MG PO CAPS: 50.0000 mg | ORAL_CAPSULE | Freq: Every day | ORAL | 0 refills | Status: DC

## 2024-02-10 ENCOUNTER — Other Ambulatory Visit: Payer: Self-pay | Admitting: Family Medicine

## 2024-03-04 ENCOUNTER — Encounter: Payer: Self-pay | Admitting: Family Medicine

## 2024-03-06 ENCOUNTER — Encounter: Payer: Self-pay | Admitting: Family Medicine

## 2024-03-06 DIAGNOSIS — F900 Attention-deficit hyperactivity disorder, predominantly inattentive type: Secondary | ICD-10-CM

## 2024-03-07 MED ORDER — LISDEXAMFETAMINE DIMESYLATE 50 MG PO CAPS: 50.0000 mg | ORAL_CAPSULE | Freq: Every day | ORAL | 0 refills | Status: DC

## 2024-03-13 MED ORDER — AMPHETAMINE-DEXTROAMPHETAMINE 10 MG PO TABS: 10.0000 mg | ORAL_TABLET | Freq: Every day | ORAL | 0 refills | Status: DC

## 2024-03-27 ENCOUNTER — Ambulatory Visit (INDEPENDENT_AMBULATORY_CARE_PROVIDER_SITE_OTHER): Admitting: Family Medicine

## 2024-03-27 ENCOUNTER — Encounter: Payer: Self-pay | Admitting: Family Medicine

## 2024-03-27 DIAGNOSIS — F900 Attention-deficit hyperactivity disorder, predominantly inattentive type: Secondary | ICD-10-CM

## 2024-03-27 MED ORDER — AMPHETAMINE-DEXTROAMPHETAMINE 10 MG PO TABS: 10.0000 mg | ORAL_TABLET | Freq: Every day | ORAL | 0 refills | Status: DC

## 2024-03-27 MED ORDER — AMPHETAMINE-DEXTROAMPHETAMINE 10 MG PO TABS: 10.0000 mg | ORAL_TABLET | Freq: Every day | ORAL | 0 refills | Status: AC

## 2024-03-27 MED ORDER — LISDEXAMFETAMINE DIMESYLATE 50 MG PO CAPS: 50.0000 mg | ORAL_CAPSULE | Freq: Every day | ORAL | 0 refills | Status: DC
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
## 2024-04-07 ENCOUNTER — Encounter: Payer: Self-pay | Admitting: Family Medicine

## 2024-04-07 DIAGNOSIS — F900 Attention-deficit hyperactivity disorder, predominantly inattentive type: Secondary | ICD-10-CM

## 2024-04-08 MED ORDER — LISDEXAMFETAMINE DIMESYLATE 50 MG PO CAPS: 50.0000 mg | ORAL_CAPSULE | Freq: Every day | ORAL | 0 refills | Status: DC

## 2024-07-11 ENCOUNTER — Telehealth: Payer: Self-pay

## 2024-07-11 ENCOUNTER — Encounter: Payer: Self-pay | Admitting: Family Medicine

## 2024-07-11 ENCOUNTER — Other Ambulatory Visit (HOSPITAL_COMMUNITY): Payer: Self-pay

## 2024-07-11 NOTE — Telephone Encounter (Signed)
 Pharmacy Patient Advocate Encounter   Received notification from Physician's Office that prior authorization for Vyvanse  50MG  capsules is required/requested.   Insurance verification completed.   The patient is insured through [employer] 2017.   Per test claim: PA required; PA submitted to above mentioned insurance via Latent Key/confirmation #/EOC [facility] Status is pending

## 2024-07-14 ENCOUNTER — Other Ambulatory Visit (HOSPITAL_COMMUNITY): Payer: Self-pay

## 2024-07-14 NOTE — Telephone Encounter (Signed)
 Pharmacy Patient Advocate Encounter  Received notification from [employer]  that Prior Authorization for Vyvanse  50MG  capsules has been APPROVED from 07/11/2024 to 07/11/2025   PA #/Case ID/Reference #: [id number]

## 2024-07-15 NOTE — Telephone Encounter (Signed)
 Left a detailed message with the approval information below on the pharmacy voicemail at [facility].

## 2024-07-21 ENCOUNTER — Encounter: Payer: Self-pay | Admitting: Family Medicine

## 2024-07-21 DIAGNOSIS — F900 Attention-deficit hyperactivity disorder, predominantly inattentive type: Secondary | ICD-10-CM

## 2024-07-21 MED ORDER — AMPHETAMINE-DEXTROAMPHETAMINE 10 MG PO TABS: 10.0000 mg | ORAL_TABLET | Freq: Every day | ORAL | 0 refills | Status: AC

## 2024-07-29 ENCOUNTER — Ambulatory Visit: Admitting: Family Medicine

## 2024-07-29 ENCOUNTER — Encounter: Payer: Self-pay | Admitting: Family Medicine

## 2024-07-29 VITALS — BP 112/70 | HR 78 | Temp 99.6°F | Ht 70.25 in | Wt 220.6 lb

## 2024-07-29 DIAGNOSIS — F900 Attention-deficit hyperactivity disorder, predominantly inattentive type: Secondary | ICD-10-CM

## 2024-07-29 DIAGNOSIS — Z0000 Encounter for general adult medical examination without abnormal findings: Secondary | ICD-10-CM | POA: Diagnosis not present

## 2024-07-29 DIAGNOSIS — Z13220 Encounter for screening for lipoid disorders: Secondary | ICD-10-CM

## 2024-07-29 LAB — LIPID PANEL
Cholesterol: 224 mg/dL — ABNORMAL HIGH (ref 28–200)
HDL: 101.5 mg/dL
LDL Cholesterol: 105 mg/dL — ABNORMAL HIGH (ref 10–99)
NonHDL: 122.67
Total CHOL/HDL Ratio: 2
Triglycerides: 90 mg/dL (ref 10.0–149.0)
VLDL: 18 mg/dL (ref 0.0–40.0)

## 2024-07-29 LAB — COMPREHENSIVE METABOLIC PANEL WITH GFR
ALT: 18 U/L (ref 3–35)
AST: 17 U/L (ref 5–37)
Albumin: 4.3 g/dL (ref 3.5–5.2)
Alkaline Phosphatase: 87 U/L (ref 39–117)
BUN: 13 mg/dL (ref 6–23)
CO2: 28 meq/L (ref 19–32)
Calcium: 9.4 mg/dL (ref 8.4–10.5)
Chloride: 100 meq/L (ref 96–112)
Creatinine, Ser: 0.58 mg/dL (ref 0.40–1.20)
GFR: 118.73 mL/min
Glucose, Bld: 87 mg/dL (ref 70–99)
Potassium: 3.7 meq/L (ref 3.5–5.1)
Sodium: 135 meq/L (ref 135–145)
Total Bilirubin: 0.4 mg/dL (ref 0.2–1.2)
Total Protein: 7.2 g/dL (ref 6.0–8.3)

## 2024-07-29 MED ORDER — LISDEXAMFETAMINE DIMESYLATE 50 MG PO CAPS: 50.0000 mg | ORAL_CAPSULE | Freq: Every day | ORAL | 0 refills | Status: AC

## 2024-07-29 NOTE — Patient Instructions (Signed)
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
## 2024-08-01 ENCOUNTER — Ambulatory Visit: Payer: Self-pay | Admitting: Family Medicine

## 2024-08-03 ENCOUNTER — Other Ambulatory Visit: Payer: Self-pay | Admitting: Family Medicine

## 2024-08-10 ENCOUNTER — Telehealth

## 2024-08-10 DIAGNOSIS — B9689 Other specified bacterial agents as the cause of diseases classified elsewhere: Secondary | ICD-10-CM

## 2024-08-11 MED ORDER — AMOXICILLIN-POT CLAVULANATE 875-125 MG PO TABS: 1.0000 | ORAL_TABLET | Freq: Two times a day (BID) | ORAL | 0 refills | Status: AC

## 2024-08-11 MED ORDER — FLUTICASONE PROPIONATE 50 MCG/ACT NA SUSP: 2.0000 | Freq: Every day | NASAL | 0 refills | Status: AC

## 2024-08-11 NOTE — Progress Notes (Signed)

## 2024-10-27 ENCOUNTER — Ambulatory Visit: Admitting: Family Medicine
